# Patient Record
Sex: Male | Born: 1990 | Race: White | Hispanic: No | Marital: Single | Smoking: Never smoker
Health system: Southern US, Community
[De-identification: ages and names within clinical notes are randomized; demographics above are authoritative.]

## PROBLEM LIST (undated history)

## (undated) DIAGNOSIS — R011 Cardiac murmur, unspecified: Secondary | ICD-10-CM

## (undated) HISTORY — PX: CARDIAC SURGERY: SHX584

---

## 2015-05-24 ENCOUNTER — Encounter (HOSPITAL_COMMUNITY): Payer: Self-pay | Admitting: Family Medicine

## 2015-05-24 ENCOUNTER — Emergency Department (HOSPITAL_COMMUNITY): Payer: Managed Care, Other (non HMO)

## 2015-05-24 ENCOUNTER — Emergency Department (HOSPITAL_COMMUNITY)
Admission: EM | Admit: 2015-05-24 | Discharge: 2015-05-24 | Disposition: A | Discharge status: Home / Self Care | Payer: Managed Care, Other (non HMO) | Attending: Emergency Medicine | Admitting: Emergency Medicine

## 2015-05-24 DIAGNOSIS — Y9389 Activity, other specified: Secondary | ICD-10-CM | POA: Insufficient documentation

## 2015-05-24 DIAGNOSIS — R011 Cardiac murmur, unspecified: Secondary | ICD-10-CM | POA: Insufficient documentation

## 2015-05-24 DIAGNOSIS — S4992XA Unspecified injury of left shoulder and upper arm, initial encounter: Secondary | ICD-10-CM | POA: Diagnosis present

## 2015-05-24 DIAGNOSIS — Y998 Other external cause status: Secondary | ICD-10-CM | POA: Insufficient documentation

## 2015-05-24 DIAGNOSIS — S43102A Unspecified dislocation of left acromioclavicular joint, initial encounter: Secondary | ICD-10-CM | POA: Diagnosis not present

## 2015-05-24 DIAGNOSIS — Y92828 Other wilderness area as the place of occurrence of the external cause: Secondary | ICD-10-CM | POA: Insufficient documentation

## 2015-05-24 HISTORY — DX: Cardiac murmur, unspecified: R01.1

## 2015-05-24 MED ORDER — NAPROXEN 500 MG PO TABS: 500.0000 mg | ORAL_TABLET | Freq: Two times a day (BID) | ORAL | Status: AC

## 2015-05-24 NOTE — Discharge Instructions (Signed)
Please read and follow all provided instructions.  Your diagnoses today include:  1. AC separation, left, initial encounter    Tests performed today include:  An x-ray of the affected area - does NOT show any broken bones, suggests AC separation  Vital signs. See below for your results today.   Medications prescribed:   Naproxen - anti-inflammatory pain medication  Do not exceed 500mg  naproxen every 12 hours, take with food  You have been prescribed an anti-inflammatory medication or NSAID. Take with food. Take smallest effective dose for the shortest duration needed for your pain. Stop taking if you experience stomach pain or vomiting.   Take any prescribed medications only as directed.  Home care instructions:   Follow any educational materials contained in this packet  Follow R.I.C.E. Protocol:  R - rest your injury   I  - use ice on injury without applying directly to skin  C - compress injury with bandage or splint  E - elevate the injury as much as possible  Follow-up instructions: Please follow-up with the provided orthopedic physician.   Return instructions:   Please return if your fingers are numb or tingling, appear gray or blue, or you have severe pain (also elevate the arm and loosen splint or wrap if you were given one)  Please return to the Emergency Department if you experience worsening symptoms.   Please return if you have any other emergent concerns.  Additional Information:  Your vital signs today were: BP 131/80 mmHg   Pulse 85   Temp(Src) 98.2 F (36.8 C) (Oral)   Resp 18   Wt 70.308 kg   SpO2 97% If your blood pressure (BP) was elevated above 135/85 this visit, please have this repeated by your doctor within one month. --------------
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
## 2017-07-07 IMAGING — DX DG CLAVICLE*L*
2 series · 2 of 2 positions shown · non-contrast
Comparison: None.

CLINICAL DATA: Pain following fall

EXAM:
LEFT CLAVICLE - 2+ VIEWS

[clavicle ap]
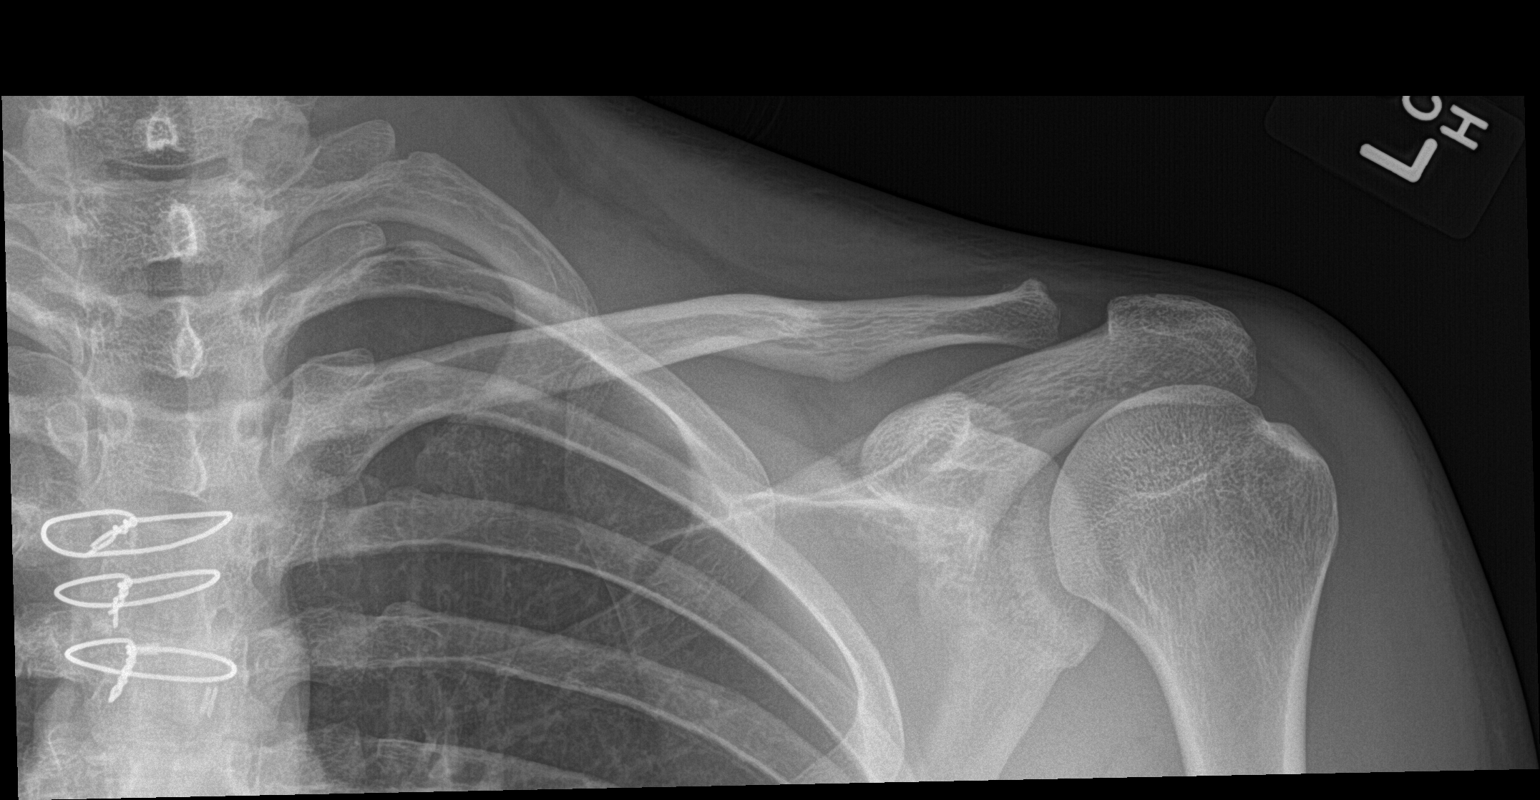

[clavicle axial]
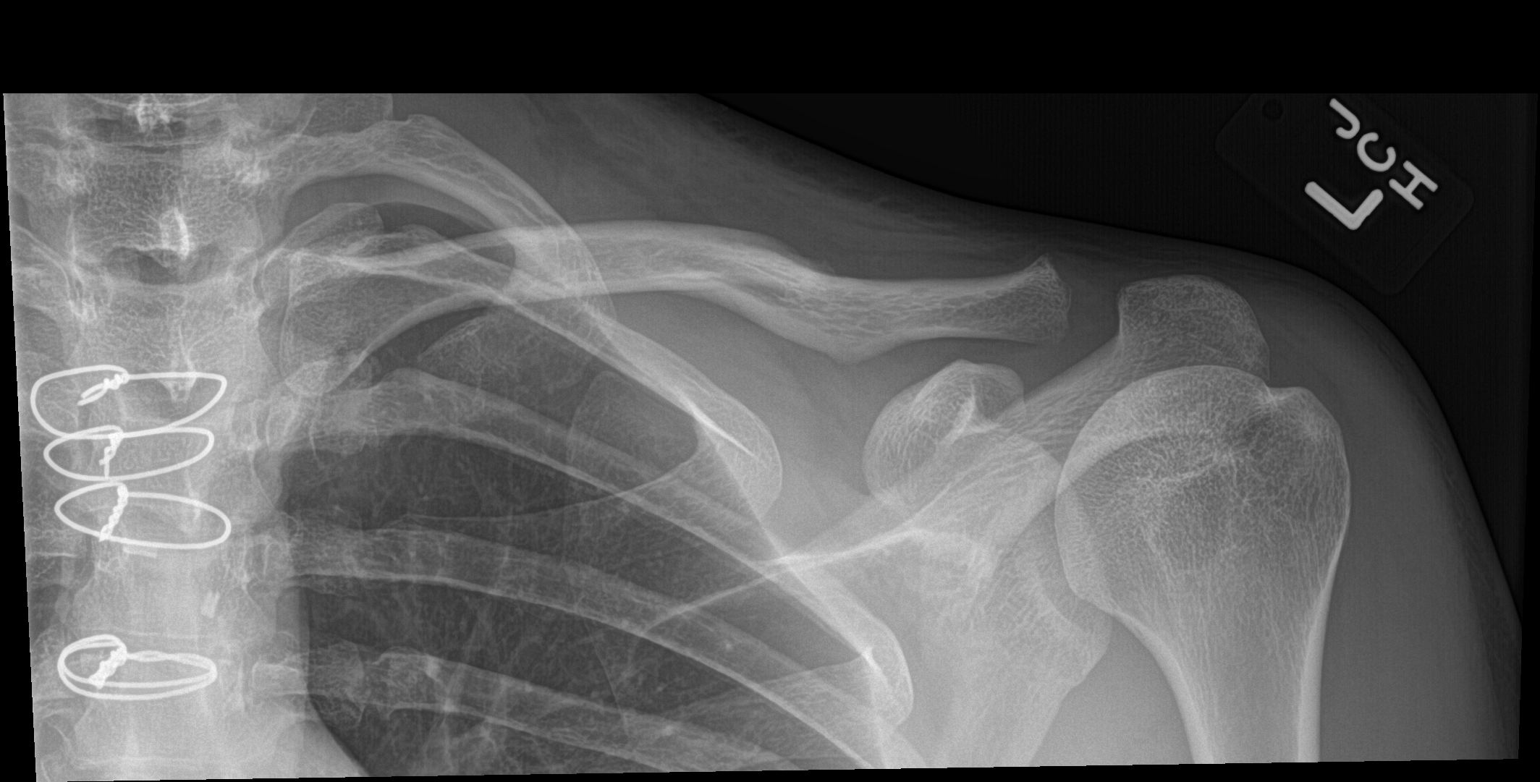

[2 of 2 positions shown; findings below may reference images not displayed]

FINDINGS: Frontal and tilt frontal images were obtained. There is no evident
fracture or dislocation. There is slight widening of the
acromioclavicular joint. The coracoclavicular joint appears normal.
Visualized left lung is clear.
IMPRESSION: Question a degree of acromioclavicular separation. No frank
dislocation or fracture evident.
